# Patient Record
Sex: Female | Born: 1975 | Race: Black or African American | Hispanic: No | Marital: Single | Smoking: Current every day smoker
Health system: Southern US, Community
[De-identification: ages and names within clinical notes are randomized; demographics above are authoritative.]

---

## 2009-09-28 ENCOUNTER — Emergency Department (HOSPITAL_COMMUNITY): Admission: EM | Admit: 2009-09-28 | Discharge: 2009-09-28 | Payer: Self-pay | Admitting: Family Medicine

## 2010-01-22 ENCOUNTER — Emergency Department (HOSPITAL_COMMUNITY)
Admission: EM | Admit: 2010-01-22 | Discharge: 2010-01-22 | Payer: Self-pay | Source: Home / Self Care | Admitting: Emergency Medicine

## 2010-03-23 LAB — WOUND CULTURE

## 2011-07-22 ENCOUNTER — Emergency Department (HOSPITAL_COMMUNITY)
Admission: EM | Admit: 2011-07-22 | Discharge: 2011-07-23 | Disposition: A | Discharge status: Other Acute Inpatient Hospital | Payer: Self-pay | Attending: Emergency Medicine | Admitting: Emergency Medicine

## 2011-07-22 ENCOUNTER — Encounter (HOSPITAL_COMMUNITY): Payer: Self-pay | Admitting: *Deleted

## 2011-07-22 DIAGNOSIS — F329 Major depressive disorder, single episode, unspecified: Secondary | ICD-10-CM

## 2011-07-22 DIAGNOSIS — F3289 Other specified depressive episodes: Secondary | ICD-10-CM | POA: Insufficient documentation

## 2011-07-22 DIAGNOSIS — IMO0002 Reserved for concepts with insufficient information to code with codable children: Secondary | ICD-10-CM | POA: Insufficient documentation

## 2011-07-22 DIAGNOSIS — F32A Depression, unspecified: Secondary | ICD-10-CM

## 2011-07-22 DIAGNOSIS — F411 Generalized anxiety disorder: Secondary | ICD-10-CM | POA: Insufficient documentation

## 2011-07-22 LAB — COMPREHENSIVE METABOLIC PANEL
Albumin: 4 g/dL (ref 3.5–5.2)
Alkaline Phosphatase: 65 U/L (ref 39–117)
BUN: 8 mg/dL (ref 6–23)
Calcium: 9.4 mg/dL (ref 8.4–10.5)
Creatinine, Ser: 0.71 mg/dL (ref 0.50–1.10)
GFR calc Af Amer: >90 mL/min (ref >90)
Glucose, Bld: 98 mg/dL (ref 70–99)
Total Protein: 7.5 g/dL (ref 6.0–8.3)

## 2011-07-22 LAB — CBC
HCT: 34.5 % — ABNORMAL LOW (ref 36.0–46.0)
Hemoglobin: 11.8 g/dL — ABNORMAL LOW (ref 12.0–15.0)
MCH: 24 pg — ABNORMAL LOW (ref 26.0–34.0)
MCHC: 34.2 g/dL (ref 30.0–36.0)
MCV: 70.3 fL — ABNORMAL LOW (ref 78.0–100.0)
RDW: 14.6 % (ref 11.5–15.5)

## 2011-07-22 LAB — RAPID URINE DRUG SCREEN, HOSP PERFORMED
Barbiturates: NOT DETECTED
Cocaine: NOT DETECTED

## 2011-07-22 LAB — ACETAMINOPHEN LEVEL: Acetaminophen (Tylenol), Serum: <15 ug/mL (ref 10–30)

## 2011-07-22 LAB — ETHANOL: Alcohol, Ethyl (B): <11 mg/dL (ref 0–11)

## 2011-07-22 MED ORDER — IBUPROFEN 600 MG PO TABS
600.0000 mg | ORAL_TABLET | Freq: Three times a day (TID) | ORAL | Status: DC | PRN
  Administered 2011-07-23: 600 mg via ORAL
  Filled 2011-07-22: qty 1

## 2011-07-22 MED ORDER — BACITRACIN ZINC 500 UNIT/GM EX OINT
TOPICAL_OINTMENT | CUTANEOUS | Status: AC
  Filled 2011-07-22: qty 0.9

## 2011-07-22 MED ORDER — LORAZEPAM 1 MG PO TABS
1.0000 mg | ORAL_TABLET | Freq: Three times a day (TID) | ORAL | Status: DC | PRN
  Administered 2011-07-22 – 2011-07-23 (×2): 1 mg via ORAL
  Filled 2011-07-22 (×2): qty 1

## 2011-07-22 MED ORDER — ALUM & MAG HYDROXIDE-SIMETH 200-200-20 MG/5ML PO SUSP: 30.0000 mL | ORAL | Status: DC | PRN

## 2011-07-22 MED ORDER — ACETAMINOPHEN 325 MG PO TABS: 650.0000 mg | ORAL_TABLET | ORAL | Status: DC | PRN

## 2011-07-22 MED ORDER — ONDANSETRON HCL 4 MG PO TABS: 4.0000 mg | ORAL_TABLET | Freq: Three times a day (TID) | ORAL | Status: DC | PRN

## 2011-07-22 NOTE — ED Notes (Signed)
Patients father into see 

## 2011-07-22 NOTE — ED Provider Notes (Signed)
History     CSN: [id number]  Arrival date & time 07/22/11  1243   First MD Initiated Contact with Patient 07/22/11 1252      Chief Complaint  Patient presents with  . Medical Clearance    (Consider location/radiation/quality/duration/timing/severity/associated sxs/prior treatment) HPI Comments: Patient presents today with feelings of depression. She states that she's had a hard time with depression over the last year it's been recently worsening. She feels like she just has ongoing vomiting and has had thoughts of suicide including slitting her wrists or overdosing on pills. She denies any specific triggers for the depression, she states that she just can't handle life anymore. She does admit to alcohol use, the last was yesterday early this morning. She states that she only drinks about one to 2 times a week. She she does use marijuana, but denies any other drug use. Denies a history of depression or past psychiatric admissions. She currently denies any physical complaints  The history is provided by the patient.    History reviewed. No pertinent past medical history.  History reviewed. No pertinent past surgical history.  No family history on file.  History  Substance Use Topics  . Smoking status: Current Everyday Smoker  . Smokeless tobacco: Not on file  . Alcohol Use: Yes    OB History    Grav Para Term Preterm Abortions TAB SAB Ect Mult Living                  Review of Systems  Constitutional: Negative for fever, chills, diaphoresis and fatigue.  HENT: Negative for congestion, rhinorrhea and sneezing.   Eyes: Negative.   Respiratory: Negative for cough, chest tightness and shortness of breath.   Cardiovascular: Negative for chest pain and leg swelling.  Gastrointestinal: Negative for nausea, vomiting, abdominal pain, diarrhea and blood in stool.  Genitourinary: Negative for frequency, hematuria, flank pain and difficulty urinating.  Musculoskeletal: Negative for  back pain and arthralgias.  Skin: Negative for rash.  Neurological: Negative for dizziness, speech difficulty, weakness, numbness and headaches.  Psychiatric/Behavioral: Positive for suicidal ideas, disturbed wake/sleep cycle and agitation. Negative for hallucinations. The patient is nervous/anxious.     Allergies  Review of patient's allergies indicates no known allergies.  Home Medications   Current Outpatient Rx  Name Route Sig Dispense Refill  . IBUPROFEN 800 MG PO TABS Oral Take 800 mg by mouth every 8 (eight) hours as needed. For pain      BP 111/63  Pulse 102  Temp 98.3 F (36.8 C) (Oral)  Resp 20  Ht 5\' 5"  (1.651 m)  SpO2 100%  LMP 06/25/2011  Physical Exam  Constitutional: She is oriented to person, place, and time. She appears well-developed and well-nourished.  HENT:  Head: Normocephalic and atraumatic.  Eyes: Pupils are equal, round, and reactive to light.  Neck: Normal range of motion. Neck supple.  Cardiovascular: Normal rate, regular rhythm and normal heart sounds.   Pulmonary/Chest: Effort normal and breath sounds normal. No respiratory distress. She has no wheezes. She has no rales. She exhibits no tenderness.  Abdominal: Soft. Bowel sounds are normal. There is no tenderness. There is no rebound and no guarding.  Musculoskeletal: Normal range of motion. She exhibits no edema.  Lymphadenopathy:    She has no cervical adenopathy.  Neurological: She is alert and oriented to person, place, and time.  Skin: Skin is warm and dry. No rash noted.  Psychiatric: Her mood appears anxious. She exhibits a depressed mood.  ED Course  Procedures (including critical care time)  Results for orders placed during the hospital encounter of 07/22/11  CBC      Component Value Range   WBC 12.2 (*) 4.0 - 10.5 K/uL   RBC 4.91  3.87 - 5.11 MIL/uL   Hemoglobin 11.8 (*) 12.0 - 15.0 g/dL   HCT 45.4 (*) 09.8 - 11.9 %   MCV 70.3 (*) 78.0 - 100.0 fL   MCH 24.0 (*) 26.0 - 34.0  pg   MCHC 34.2  30.0 - 36.0 g/dL   RDW 14.7  82.9 - 56.2 %   Platelets 378  150 - 400 K/uL  COMPREHENSIVE METABOLIC PANEL      Component Value Range   Sodium 136  135 - 145 mEq/L   Potassium 3.4 (*) 3.5 - 5.1 mEq/L   Chloride 104  96 - 112 mEq/L   CO2 17 (*) 19 - 32 mEq/L   Glucose, Bld 98  70 - 99 mg/dL   BUN 8  6 - 23 mg/dL   Creatinine, Ser 1.30  0.50 - 1.10 mg/dL   Calcium 9.4  8.4 - 86.5 mg/dL   Total Protein 7.5  6.0 - 8.3 g/dL   Albumin 4.0  3.5 - 5.2 g/dL   AST 17  0 - 37 U/L   ALT 8  0 - 35 U/L   Alkaline Phosphatase 65  39 - 117 U/L   Total Bilirubin 0.4  0.3 - 1.2 mg/dL   GFR calc non Af Amer >90  >90 mL/min   GFR calc Af Amer >90  >90 mL/min  ETHANOL      Component Value Range   Alcohol, Ethyl (B) <11  0 - 11 mg/dL  ACETAMINOPHEN LEVEL      Component Value Range   Acetaminophen (Tylenol), Serum <15.0  10 - 30 ug/mL  URINE RAPID DRUG SCREEN (HOSP PERFORMED)      Component Value Range   Opiates NONE DETECTED  NONE DETECTED   Cocaine NONE DETECTED  NONE DETECTED   Benzodiazepines NONE DETECTED  NONE DETECTED   Amphetamines NONE DETECTED  NONE DETECTED   Tetrahydrocannabinol POSITIVE (*) NONE DETECTED   Barbiturates NONE DETECTED  NONE DETECTED  POCT PREGNANCY, URINE      Component Value Range   Preg Test, Ur NEGATIVE  NEGATIVE   No results found.   1. Depression       MDM  Pt with depression, SI.  Awaiting ACT evaluation        Rolan Bucco, MD 07/22/11 1406

## 2011-07-22 NOTE — ED Notes (Signed)
ACT into see 

## 2011-07-22 NOTE — ED Notes (Signed)
Pt feeling stressed out when talking with Dr Fredderick Phenix, pt states she promised her wife she would not do this.

## 2011-07-22 NOTE — ED Notes (Signed)
EMS reports pt call cousin due to being upset, he called 911, cousin requesting she be taken to the [place]

## 2011-07-22 NOTE — ED Notes (Signed)
Report called to Janie, RN in Ps[facility] ED 

## 2011-07-22 NOTE — ED Notes (Signed)
Pt states she is having anxiety, feelings of wanting to harm self. Has plan to slit wrist and take some pills. Pt is hyperventilating during triage assessment

## 2011-07-22 NOTE — ED Notes (Signed)
WUJ:[id number]<YN> Expected date:07/22/11<BR> Expected time:12:32 PM<BR> Means of arrival:Ambulance<BR> Comments:<BR> V 70.1
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
## 2011-07-23 ENCOUNTER — Encounter (HOSPITAL_COMMUNITY): Payer: Self-pay | Admitting: *Deleted

## 2011-07-23 ENCOUNTER — Inpatient Hospital Stay (HOSPITAL_COMMUNITY)
Admission: AD | Admit: 2011-07-23 | Discharge: 2011-07-26 | DRG: 880 | Disposition: A | Discharge status: Home / Self Care | Payer: Federal, State, Local not specified - Other | Source: Ambulatory Visit | Attending: Psychiatry | Admitting: Psychiatry

## 2011-07-23 DIAGNOSIS — F1021 Alcohol dependence, in remission: Secondary | ICD-10-CM | POA: Diagnosis present

## 2011-07-23 DIAGNOSIS — F609 Personality disorder, unspecified: Secondary | ICD-10-CM | POA: Diagnosis present

## 2011-07-23 DIAGNOSIS — F1220 Cannabis dependence, uncomplicated: Secondary | ICD-10-CM | POA: Diagnosis present

## 2011-07-23 DIAGNOSIS — R45851 Suicidal ideations: Secondary | ICD-10-CM

## 2011-07-23 DIAGNOSIS — F411 Generalized anxiety disorder: Principal | ICD-10-CM | POA: Diagnosis present

## 2011-07-23 MED ORDER — ALUM & MAG HYDROXIDE-SIMETH 200-200-20 MG/5ML PO SUSP: 30.0000 mL | ORAL | Status: DC | PRN

## 2011-07-23 MED ORDER — MAGNESIUM HYDROXIDE 400 MG/5ML PO SUSP: 30.0000 mL | Freq: Every day | ORAL | Status: DC | PRN

## 2011-07-23 MED ORDER — TRAZODONE HCL 50 MG PO TABS
50.0000 mg | ORAL_TABLET | Freq: Every evening | ORAL | Status: DC | PRN
  Administered 2011-07-23: 50 mg via ORAL
  Filled 2011-07-23: qty 1

## 2011-07-23 MED ORDER — ZOLPIDEM TARTRATE 10 MG PO TABS: 10.0000 mg | ORAL_TABLET | Freq: Once | ORAL | Status: DC

## 2011-07-23 MED ORDER — NAPROXEN 500 MG PO TABS
500.0000 mg | ORAL_TABLET | Freq: Two times a day (BID) | ORAL | Status: DC | PRN
Start: 2011-07-23 — End: 2011-07-26
  Administered 2011-07-23 – 2011-07-24 (×2): 500 mg via ORAL
  Filled 2011-07-23 (×2): qty 1

## 2011-07-23 MED ORDER — ZOLPIDEM TARTRATE 10 MG PO TABS
ORAL_TABLET | ORAL | Status: AC
  Administered 2011-07-23: 10 mg
  Filled 2011-07-23: qty 1

## 2011-07-23 MED ORDER — CITALOPRAM HYDROBROMIDE 20 MG PO TABS
20.0000 mg | ORAL_TABLET | Freq: Every day | ORAL | Status: DC
  Administered 2011-07-23 – 2011-07-26 (×4): 20 mg via ORAL
  Filled 2011-07-23 (×4): qty 1
  Filled 2011-07-23: qty 14
  Filled 2011-07-23 (×2): qty 1

## 2011-07-23 MED ORDER — ACETAMINOPHEN 325 MG PO TABS: 650.0000 mg | ORAL_TABLET | Freq: Four times a day (QID) | ORAL | Status: DC | PRN
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
## 2011-07-23 NOTE — Progress Notes (Signed)
Pt has been in bed since this writer came on shift at 1900.  Pt reports she just came on the unit this afternoon.  She is still very depressed, but contracts for safety on the unit.  She says she has anger issues and wants help dealing with her emotions.  She is requesting something for sleep at bedtime which will be given at the appropriate time.  She voices no other needs/concerns.  Safety maintained with q15 minute checks.

## 2011-07-23 NOTE — Progress Notes (Signed)
Patient ID: Tracie Cannon, female   DOB: [birth year], 36 y.o.   MRN: [id number] Pt was admitted for increasing depression and anger.  Says that she really came in because "I get so angry and I can't control it.  I blow up and it is like I'm another person."  Said when she gets angry at work she has to walk away and walk around awhile to cool off.  Says she has shut everyone out.  Says she has been the strong one everyone leans on and she doesn't know how to lean on others.  She and her wife  have recently lost twin girls to miscarriage on Valentine's day.  She and her wife have been in [facility] for this.  She has no medical problems except back pain that stays 3/10.  Says that she usees marijuana or a drink at bedtime to help ease the pain for sleep.  She has no allergies and has not been on any prescribed meds.  She did serve in [employer]  and does have flashbacks from that time.  Pt is quiet spoken, cooperative and works full time for the postal service.  She says her job is stressful.  Pt denies feeling suicidal at this time, but she did have SI with a plan to overdose and cut her wrists prior to admission. Her goal is to find out why she gets so anrgy and wants it to stop. Pt declined to go to lunch, says she is not hungry.  She is resting in bed as she has menstrual cramps.

## 2011-07-23 NOTE — ED Provider Notes (Signed)
8:19 AM Filed Vitals:   07/23/11 0536  BP: 105/66  Pulse: 81  Temp: 98.1 F (36.7 C)  Resp: 20   No complaints this AM. Would like a bath. Will arrange. Awaiting placement at this time  Lyanne Co, MD 07/23/11 [phone]

## 2011-07-24 DIAGNOSIS — F411 Generalized anxiety disorder: Secondary | ICD-10-CM | POA: Diagnosis present

## 2011-07-24 LAB — TSH: TSH: 1.193 u[IU]/mL (ref 0.350–4.500)

## 2011-07-24 MED ORDER — HYDROXYZINE HCL 25 MG PO TABS
25.0000 mg | ORAL_TABLET | Freq: Three times a day (TID) | ORAL | Status: DC | PRN
  Administered 2011-07-24 – 2011-07-25 (×2): 25 mg via ORAL
  Filled 2011-07-24: qty 42
  Filled 2011-07-24: qty 1

## 2011-07-24 MED ORDER — TRAZODONE HCL 100 MG PO TABS
100.0000 mg | ORAL_TABLET | Freq: Every day | ORAL | Status: DC
  Administered 2011-07-24 – 2011-07-25 (×2): 100 mg via ORAL
  Filled 2011-07-24: qty 14
  Filled 2011-07-24 (×4): qty 1

## 2011-07-24 NOTE — Progress Notes (Signed)
Grief and Loss Group  Facilitated a grief and loss group with Elige Radon McKibben, MS, LPCA, NCC. Dr. Burnett Sheng, writer's and Bradley's supervisor, was also in attendance. The group discussed and processed their experiences of grief and loss. They also shared associated emotions including sadness, guilt, fear, loneliness, etc. Group also discussed the process of grief and recognized the different places they are in within their grief experiences. As a whole, group members were supportive of each other. At the end of the group, members were encouraged to choose a picture that represented their experience of grief and share it with the group.  Patient was quiet and respectful during the group. She shared her experience regarding the loss of her child and how painful it has been for her. She shared that she not only grieves her child, but she also grieves the loss of having a relationship with her child (since the child died as an infant). Another group member shared feelings of guilt and a loss of identity, which the patient resonated with as well. She said that at this point, she feels like she lost who she was an she is unsure how to move forward. At the end of the group, the patient chose a picture of a baby footprint made of rocks. She said that for her, it represents the last thing that she has of her child (a footprint). Patient was very respectful and supportive of other group members.  Evelene Croon MA, MED, LPCA, NCC

## 2011-07-24 NOTE — Progress Notes (Signed)
[facility] Group Notes: (Counselor/Nursing/MHT/Case Management/Adjunct) 07/24/2011   @ 1:15-2:30PM Feelings About Diagnosis  Type of Therapy:  Group Therapy  Participation Level:  Active  Participation Quality: Appropriate, Sharing, Supportive   Affect:  Blunted  Cognitive:  Appropriate  Insight:  Good  Engagement in Group: Good   Engagement in Therapy:  Good  Modes of Intervention:  Support and Exploration  Summary of Progress/Problems: Tracie Cannon explored how views on mental illness have changed over the last several decades. She stated that in her family and culture, it is still very taboo to need help with one's mental/emotional wellness, and many in that culture treat her as though she is fragile and can lose control at any time. Tracie Cannon processed her own thoughts, stating that the things she has gone through have brought her to this point and sometimes she needs guidance in how to take back her ability to control her emotions. She explored the tenets of the Recovery Model and seemed interested in not only the model but also in a peer community.  Angus Palms, LCSW 07/24/2011  4:10 PM

## 2011-07-24 NOTE — Progress Notes (Signed)
(  D) Patient states that she did not sleep well last night, appetite poor only eating 1% of breakfast, energy level low, ability to pay attention improving rates depression 2/10 and hopelessness as 0/10. Denies SI/HI and psychosis. (A) Encouraged patient to attend groups. Encouraged and supported. (R) Shares that at discharge she wants to improve communication with her wife and family and to develop a broader support network. [name] RN MS EdS 07/24/2011  1:12 PM

## 2011-07-24 NOTE — Progress Notes (Addendum)
[facility] MD Progress Note  07/24/2011 4:11 PM  "I feel much better today. I could do better if I can sleep better. I was giving some medicine for sleep last night, it did not help quite much. My anxiety gets worse if I don't sleep well"  Diagnosis:  Axis I: Generalized Anxiety Disorder Axis II: Deferred Axis III: No past medical history on file. Axis IV: other psychosocial or environmental problems Axis V: 11-20 some danger of hurting self or others possible OR occasionally fails to maintain minimal personal hygiene OR gross impairment in communication  ADL's:  Intact  Sleep: Fair  Appetite:  Good  Suicidal Ideation:  Plan:  No Intent:  No Means:  No Homicidal Ideation:  Plan:  No Intent:  No Means:  No  AEB (as evidenced by): Per patient  Mental Status Examination/Evaluation: Objective:  Appearance: Casual  Eye Contact::  Good  Speech:  Clear and Coherent  Volume:  Normal  Mood:  "I feel much better"  Affect:  Appropriate  Thought Process:  Coherent  Orientation:  Full  Thought Content:  Rumination  Suicidal Thoughts:  No  Homicidal Thoughts:  No  Memory:  Immediate;   Good Recent;   Good Remote;   Good  Judgement:  Good  Insight:  Fair  Psychomotor Activity:  Normal  Concentration:  Good  Recall:  Good  Akathisia:  No  Handed:  Right  AIMS (if indicated):     Assets:  Desire for Improvement  Sleep:  Number of Hours: 4.25    Vital Signs:Blood pressure 119/83, pulse 94, temperature 97.5 F (36.4 C), temperature source Oral, resp. rate 16, height 5\' 4"  (1.626 m), weight 78.926 kg (174 lb), last menstrual period 07/23/2011. Current Medications: Current Facility-Administered Medications  Medication Dose Route Frequency Provider Last Rate Last Dose  . acetaminophen (TYLENOL) tablet 650 mg  650 mg Oral Q6H PRN Verne Spurr, PA-C      . alum & mag hydroxide-simeth (MAALOX/MYLANTA) 200-200-20 MG/5ML suspension 30 mL  30 mL Oral Q4H PRN Verne Spurr, PA-C      .  citalopram (CELEXA) tablet 20 mg  20 mg Oral Daily Verne Spurr, PA-C   20 mg at 07/24/11 0818  . magnesium hydroxide (MILK OF MAGNESIA) suspension 30 mL  30 mL Oral Daily PRN Verne Spurr, PA-C      . naproxen (NAPROSYN) tablet 500 mg  500 mg Oral BID PRN Sanjuana Kava, NP   500 mg at 07/24/11 0645  . traZODone (DESYREL) tablet 50 mg  50 mg Oral QHS PRN Verne Spurr, PA-C   50 mg at 07/23/11 2344    Lab Results:  Results for orders placed during the hospital encounter of 07/23/11 (from the past 48 hour(s))  TSH     Status: Normal   Collection Time   07/23/11  8:05 PM      Component Value Range Comment   TSH 1.193  0.350 - 4.500 uIU/mL     Physical Findings: AIMS: Facial and Oral Movements Muscles of Facial Expression: None, normal Lips and Perioral Area: None, normal Jaw: None, normal Tongue: None, normal,Extremity Movements Upper (arms, wrists, hands, fingers): None, normal Lower (legs, knees, ankles, toes): None, normal, Trunk Movements Neck, shoulders, hips: None, normal, Overall Severity Severity of abnormal movements (highest score from questions above): None, normal Incapacitation due to abnormal movements: None, normal Patient's awareness of abnormal movements (rate only patient's report): No Awareness, Dental Status Current problems with teeth and/or dentures?: No Does patient usually  wear dentures?: No  CIWA:    COWS:     Treatment Plan Summary: Daily contact with patient to assess and evaluate symptoms and progress in treatment Medication management  Plan: Increase Trazodone from 50 to 100 mg Q hs. Hydroxyzine 25 mg tid prn for anxiety. Continue current treatment plan.  Armandina Stammer I 07/24/2011, 4:11 PM

## 2011-07-24 NOTE — [facility] Counselor (Signed)
Adult Comprehensive Assessment  Patient ID: Tracie Cannon, female   DOB: 05-24-1975, 36 y.o.   MRN: [id number]  Information Source: Information source: Patient  Current Stressors:  Educational / Learning stressors: no stressors reported Employment / Job issues: gets very stressed and anxious at work Family Relationships: stress over loss of 2 unborn Ecologist / Lack of resources (include bankruptcy): financial problems - bills Housing / Lack of housing: no stressors reported Physical health (include injuries & life threatening diseases): chronic back pain Social relationships: does not know how to access supports Substance abuse: marijuana and a few shots of alcohol at bedtime to control pain Bereavement / Loss: miscarriage of twins 6 months ago and their due date (next week)  Living/Environment/Situation:  Living Arrangements: Spouse/significant other Living conditions (as described by patient or guardian): lives with wife How long has patient lived in current situation?: 4 years What is atmosphere in current home: Comfortable;Loving;Supportive  Family History:  Marital status: Married Number of Years Married: 1  What types of issues is patient dealing with in the relationship?: supportive  Does patient have children?: No (twins miscarried 6 months ago)  Childhood History:  By whom was/is the patient raised?: Both parents Description of patient's relationship with caregiver when they were a child: good  Patient's description of current relationship with people who raised him/her: close with both Does patient have siblings?: Yes Number of Siblings: 3  Description of patient's current relationship with siblings: 2 sis 1 bro, pretty supportive but has not reached out to them Did patient suffer any verbal/emotional/physical/sexual abuse as a child?: No Did patient suffer from severe childhood neglect?: No Has patient ever been sexually abused/assaulted/raped as an adolescent or  adult?: No Was the patient ever a victim of a crime or a disaster?: No Witnessed domestic violence?: No Has patient been effected by domestic violence as an adult?: No  Education:  Highest grade of school patient has completed: some college for computer information systems Currently a student?: Yes If yes, how has current illness impacted academic performance: has not impacted Name of school: [employer] with plan to transfer to [employer] person: unknown How long has the patient attended?: 1 year Learning disability?: No  Employment/Work Situation:   Employment situation: Employed 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 How long has patient been employed?: 4 years Patient's job has been impacted by current illness: No What is the longest time patient has a held a job?: 8 &1/2 years 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 Has patient ever been in the [employer]?: Yes (Describe in comment) Has patient ever served in combat?: Yes Patient description of combat service: [place] combat experience, but also 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  Surveyor, quantity Resources:   Surveyor, quantity resources: Income from spouse Does patient have a representative payee or guardian?: No  Alcohol/Substance Abuse:   What has been your use of drugs/alcohol within the last 12 months?: started smoking weed to hlep sleep 6 months after getting out of [employer], now drinking a couple of shots before bed Alcohol/Substance Abuse Treatment Hx: Denies past history Has alcohol/substance abuse ever caused legal problems?: No  Social Support System:   Forensic psychologist System: Good Describe Community Support System: wife, parents, 5 siblings, friends  Type of faith/religion: n/a How does patient's faith help to cope with current illness?: n/a  Leisure/Recreation:   Leisure and Hobbies: goes to [employer] group for loss of children  Strengths/Needs:  What  things does the patient do well?: determined and  In what areas does patient struggle / problems for patient: anxiety, depression, suicidal thoughts, recently wife had a miscarriage of twins, things began tumbling down after the miscarriage, financial anxiety, trying to go back to school  Discharge Plan:   Does patient have access to transportation?: Yes Will patient be returning to same living situation after discharge?: Yes Currently receiving community mental health services: Yes (From Whom) ([facility], and just called to set up counseling with [facility] ) If no, would patient like referral for services when discharged?: Yes (What county?) ([place] - [employer] psychiatrist) Does patient have financial barriers related to discharge medications?: No  Summary/Recommendations:   Summary and Recommendations (to be completed by the evaluator): Tracie Cannon is a 36 year old married female diagnosed with PTSD. She reports that the main stressor is the miscarriage her wife suffered with their twins in February. Reports she is also stressed about money and her job. Has been out of the [employer] for 2 years but still is not used to [employer] life and has not been able to reach out to the people who would be supportive of her. Tracie Cannon would beneift from crisis staibilization, medication evaluation, therapy groups for processing thoughts/feelings/experiences, psychoed groups for coping skills and case management for discharge planning.   Tracie Cannon, Tracie Cannon. 07/24/2011

## 2011-07-24 NOTE — Progress Notes (Signed)
Patient seen during during d/c planning group and or treatment team.  She reports prior to admission but is currently denying SI/HI.  She reports becoming increasingly depressed following partner having a miscarriage.  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  She rates depression at one and and anxiety at seven. She denies helplessness and hopelessness.

## 2011-07-24 NOTE — [facility] Suicide Risk Assessment (Signed)
Suicide Risk Assessment  Admission Assessment     Demographic factors:  See chart.  Current Mental Status:  Patient seen and evaluated. Chart reviewed. Patient stated that her mood was "ok". Her affect was mood congruent and constricted. She denied any current thoughts of self injurious behavior, suicidal ideation or homicidal ideation. There were no auditory or visual hallucinations, paranoia, delusional thought processes, or mania noted.  Thought process was linear and goal directed.  No psychomotor agitation or retardation was noted. Speech was normal rate, tone and volume. Eye contact was good. Judgment and insight are fair.  Patient has been up and engaged on the unit.  No acute safety concerns reported from team.    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  SUICIDE RISK: Pt viewed as a chronic increased risk of harm to self in light of her past hx and risk factors.  No acute safety concerns on the unit.  Pt contracting for safety and in need of crisis stabilization & Tx.  VS:  Filed Vitals:   07/25/11 0601  BP: 131/81  Pulse: 84  Temp:   Resp:    Meds:    . citalopram  20 mg Oral Daily  . traZODone  100 mg Oral QHS    PLAN OF CARE: Pt admitted for crisis stabilization and treatment.  Please see orders, initiated on above meds per team.  Medications reviewed with pt and medication education provided.  Will continue q15 minute checks per unit protocol.  No clinical indication for one on one level of  observation at this time.  Pt contracting for safety.  Mental health treatment, medication management and continued sobriety will mitigate against the increased risk of harm to self and/or others.  Discussed the importance of recovery and Tx with pt, as well as, tools to move forward in a healthy & safe manner.  Pt agreeable with the plan.  Discussed with the team.   Lupe Carney 07/24/2011, 4:55 PM

## 2011-07-24 NOTE — Progress Notes (Signed)
Psychoeducational Group Note  Date:  07/24/2011 Time:  1100  Group Topic/Focus:  Recovery Goals:   The focus of this group is to identify appropriate goals for recovery and establish a plan to achieve them.  Participation Level:  Active  Participation Quality:  Appropriate and Attentive  Affect:  Appropriate  Cognitive:  Alert and Appropriate  Insight:  Good  Engagement in Group:  Good  Additional Comments:  Pt. Participated in group and was able to identify her definition of recovery, changes she needs to make, and steps to make changes.   Tracie Cannon [facility] 07/24/2011, 7:38 PM

## 2011-07-24 NOTE — Therapy (Signed)
Psychoeducational Group Note  Date:  07/24/2011 Time:  2005  Group Topic/Focus:  Wrap-Up Group:   The focus of this group is to help patients review their daily goal of treatment and discuss progress on daily workbooks.  Participation Level:  Active  Participation Quality:  Sharing and Supportive  Affect:  Appropriate  Cognitive:  Appropriate  Insight:  Good  Engagement in Group:  Good  Additional Comments:   Patient attended and participated in wrap-up group this evening. Patient shared that her goal for today was attend each and every group time and that she did achieve that goal today.  Javien Tesch, Newton Pigg 07/24/2011, 9:04 PM

## 2011-07-24 NOTE — Tx Team (Signed)
Interdisciplinary Treatment Plan Update (Adult)  Date:  07/24/2011  Time Reviewed:  10:39 AM   Progress in Treatment: Attending groups:   Yes   Participating in groups:  Yes Taking medication as prescribed:  Yes Tolerating medication:  Yes Family/Significant othe contact made: Counselor to make contact with family Patient understands diagnosis:  Yes Discussing patient identified problems/goals with staff: Yes Medical problems stabilized or resolved: Yes Denies suicidal/homicidal ideation:Yes Issues/concerns per patient self-inventory:  Other:  New problem(s) identified:  Reason for Continuation of Hospitalization: Anxiety Depression Medication stabilization  Interventions implemented related to continuation of hospitalization:  Medication Management; safety checks q 15 mins  Additional comments:  Estimated length of stay: 3-4 days  Discharge Plan:  Home with outpatient follow up  New goal(s):  Review of initial/current patient goals per problem list:    1.  Goal(s): Eliminate SI/other thoughts of self harm ]  Met:  Yes  Target date: d/c  As evidenced by: Patient no longer endorses SI/other thought self harm    2.  Goal (s): Reduce anxiety (rated at seven today)   Met:  No  Target date: d/c  As evidenced by: Patient will rate symptoms at four or below  3.  Goal(s):  .Reduce depression (patient rates depression at one today)   Met:  Yes  Target date: d/c  As evidenced by: Patient currently rating symptoms at below four    4.  Goal(s): .stabilize on meds   Met:  No  Target date: d/c  As evidenced by: Patient will report being stable on medications - symptoms have decreased    Attendees: Patient:  Tracie Cannon 07/24/2011 10:39 AM  Other: Serena Colonel, NP   07/24/2011 10:40 AM  Nursing:  Arrie Eastern, RN 07/24/2011 10:39 AM   Nursing:   Neill Loft, RN 07/24/2011 10:39 AM   CaseManager:  Juline Patch, LCSW 07/24/2011 10:39 AM   Counselor:   Angus Palms, LCSW 07/24/2011 10:39 AM   Other:  Reyes Ivan, LCSWA Other:  Dawn Lexine Baton, RN      07/24/2011 10:41 AM

## 2011-07-25 MED ORDER — PROPRANOLOL HCL 20 MG PO TABS
20.0000 mg | ORAL_TABLET | Freq: Four times a day (QID) | ORAL | Status: DC
  Administered 2011-07-26 (×2): 20 mg via ORAL
  Filled 2011-07-25: qty 56
  Filled 2011-07-25 (×3): qty 1
  Filled 2011-07-25 (×3): qty 56
  Filled 2011-07-25 (×2): qty 1

## 2011-07-25 MED ORDER — PROPRANOLOL HCL 10 MG PO TABS
10.0000 mg | ORAL_TABLET | Freq: Four times a day (QID) | ORAL | Status: AC
  Administered 2011-07-25 (×2): 10 mg via ORAL
  Filled 2011-07-25 (×4): qty 1

## 2011-07-25 NOTE — Progress Notes (Signed)
[facility] MD Progress Note  07/25/2011 11:05 PM  S/O: "I still feel quite anxious at times."  Pt describes waves of anxiety that sweep over her leaving her numb.  Diagnosis:  Axis I: Generalized Anxiety Disorder Axis II: Deferred Axis III: No past medical history on file. Axis IV: other psychosocial or environmental problems Axis V: 11-20 some danger of hurting self or others possible OR occasionally fails to maintain minimal personal hygiene OR gross impairment in communication  ADL's:  Intact  Sleep: Fair  Appetite:  Good  Suicidal Ideation:  Plan:  No Intent:  No Means:  No Homicidal Ideation:  Plan:  No Intent:  No Means:  No  AEB (as evidenced by): Per patient  Mental Status Examination/Evaluation: Objective:  Appearance: Casual  Eye Contact::  Good  Speech:  Clear and Coherent  Volume:  Normal  Mood:  Anxious  Affect:  Appropriate  Thought Process:  Coherent  Orientation:  Full  Thought Content:  Rumination  Suicidal Thoughts:  No  Homicidal Thoughts:  No  Memory:  Immediate;   Good Recent;   Good Remote;   Good  Judgement:  Good  Insight:  Fair  Psychomotor Activity:  Normal  Concentration:  Good  Recall:  Good  Akathisia:  No  Handed:  Right  AIMS (if indicated):     Assets:  Desire for Improvement  Sleep:  Number of Hours: 6    Vital Signs:Blood pressure 107/72, pulse 74, temperature 96.8 F (36 C), temperature source Oral, resp. rate 16, height 5\' 4"  (1.626 m), weight 78.926 kg (174 lb), last menstrual period 07/23/2011. Current Medications: Current Facility-Administered Medications  Medication Dose Route Frequency Provider Last Rate Last Dose  . acetaminophen (TYLENOL) tablet 650 mg  650 mg Oral Q6H PRN Verne Spurr, PA-C      . alum & mag hydroxide-simeth (MAALOX/MYLANTA) 200-200-20 MG/5ML suspension 30 mL  30 mL Oral Q4H PRN Verne Spurr, PA-C      . citalopram (CELEXA) tablet 20 mg  20 mg Oral Daily Verne Spurr, PA-C   20 mg at 07/25/11 0758    . hydrOXYzine (ATARAX/VISTARIL) tablet 25 mg  25 mg Oral TID PRN Sanjuana Kava, NP   25 mg at 07/25/11 0758  . magnesium hydroxide (MILK OF MAGNESIA) suspension 30 mL  30 mL Oral Daily PRN Verne Spurr, PA-C      . naproxen (NAPROSYN) tablet 500 mg  500 mg Oral BID PRN Sanjuana Kava, NP   500 mg at 07/24/11 0645  . propranolol (INDERAL) tablet 10 mg  10 mg Oral QID Mike Craze, MD   10 mg at 07/25/11 2147   Followed by  . propranolol (INDERAL) tablet 20 mg  20 mg Oral QID Mike Craze, MD      . traZODone (DESYREL) tablet 100 mg  100 mg Oral QHS Sanjuana Kava, NP   100 mg at 07/25/11 2147    Lab Results:  No results found for this or any previous visit (from the past 48 hour(s)).  Physical Findings: AIMS: Facial and Oral Movements Muscles of Facial Expression: None, normal Lips and Perioral Area: None, normal Jaw: None, normal Tongue: None, normal,Extremity Movements Upper (arms, wrists, hands, fingers): None, normal Lower (legs, knees, ankles, toes): None, normal, Trunk Movements Neck, shoulders, hips: None, normal, Overall Severity Severity of abnormal movements (highest score from questions above): None, normal Incapacitation due to abnormal movements: None, normal Patient's awareness of abnormal movements (rate only patient's report): No Awareness,  Dental Status Current problems with teeth and/or dentures?: No Does patient usually wear dentures?: No  CIWA:    COWS:     Treatment Plan Summary: Daily contact with patient to assess and evaluate symptoms and progress in treatment Medication management  Plan:  Pt describes anxiety that leaves her numb over her face and legs.  This sounds like the peripheral effects of the stress response.  She notes not being able to remember things and how to do things then this happens.  Will try Inderal for that.  She is anxious to leave tomorrow.  Will see how this Inderal helps her.  She will follow up with the [place].  Melondy Blanchard,  Celene Pippins 07/25/2011, 11:05 PM

## 2011-07-25 NOTE — Progress Notes (Signed)
Patient seen during d/c planning group. She reports being much better and hopes to discharge home soon. She rates anxiety at one and depression at zero.  Patient reports medication has really helpled.

## 2011-07-25 NOTE — Progress Notes (Signed)
[facility] Adult Inpatient Family/Significant Other Suicide Prevention Education  Patient Refusal for Family/Significant Other Suicide Prevention Education: The patient Tracie Cannon has refused to provide written consent for family/significant other to be provided Family/Significant Other Suicide Prevention Education during admission and/or prior to discharge.  Physician notified.  Tracie Cannon reported that her wife is a Designer, jewellery who has had training in suicide prevention, so she already knows all the information that would be given to her. She verbalized understanding of suicide risks, warning signs, and crisis contacts as reviewed by counselor. Tracie Cannon reports she has no guns in the home.   [name] 07/25/2011, 3:22 PM

## 2011-07-25 NOTE — Progress Notes (Signed)
[facility] Group Notes:  (Counselor/Nursing/MHT/Case Management/Adjunct)  07/25/2011 2:55 PM  Type of Therapy:  Psychoeducational Skills  Participation Level:  Active  Participation Quality:  Appropriate  Affect:  Appropriate  Cognitive:  Appropriate  Insight:  Good  Engagement in Group:  Good  Engagement in Therapy:  Good  Modes of Intervention:  Education  Summary of Progress/Problems:STAFF PRESENTED A GROUP ON EMOTION EDUCATION. THE GROUP DISCUSSED WHAT FEELINGS OR EMOTIONS ARE, AND HOW THEY ARE EXPERIENCED. STAFF EXPLAINED THAT HAVING EMOTIONS IS JUST A PART OF BEING HUMAN.  STAFF ENCOURAGED THE PATIENT TO OPENLY SHARE WHAT FEELINGS ARE  AND TO STATE ONE WORD THAT DESCRIBES HOW THEY FEEL ON TODAY. PATIENT WAS ENGAGED IN THE DISCUSSION AND PROVIDED THE GROUP WITH FEEDBACK ON HOW SHE FELT ON TODAY. PATIENT STATED THAT HE FELT GOOD ON TODAY AND THAT SHE ENJOYED THE GROUP. STAFF PROVIDED THE PATIENT ENCOURAGEMENT AND SUPPORT.    Ardelle Park O 07/25/2011, 2:55 PM

## 2011-07-25 NOTE — Progress Notes (Signed)
Pt reports she has had a good day.  She had visitors this evening.  She states the medications are working and her depression level is lower.  She denies SI/HI/AV.  She did ask about her meds and if she was on any narcotics.  This Clinical research associate did med educations with pt.  Pt states she has learned much since being admitted.  She feels this admission has been good for her.  She voices no other needs/concerns at this time.  Safety maintained with q15 minute checks.

## 2011-07-25 NOTE — Progress Notes (Signed)
Pt visible on the unit and participating this evening.  Pt observed interacting appropriately with other patients and staff.  She is a little more talkative this evening, but still somewhat guarded.  She requested that this writer give her a list of the medications she is on so she could tell her wife.  She also said one of her relatives was a Warden/ranger, and wanted to know.  Pt was given this hand written list as requested.  Pt denies SI/HI/AV.  Pt voiced no needs/concerns at this time.  Encouraged pt to make her needs known to staff.  Safety maintained with q15 minute checks.

## 2011-07-25 NOTE — Progress Notes (Signed)
[facility] Group Notes:  (Counselor/Nursing/MHT/Case Management/Adjunct)  07/25/2011 11:52 AM  Type of Therapy:  Psychoeducational Skills  Participation Level:  Active  Participation Quality:  Appropriate, Attentive, Redirectable and Resistant  Affect:  Appropriate and Blunted  Cognitive:  Alert, Appropriate and Oriented  Insight:  Good  Engagement in Group:  Limited  Engagement in Therapy:  n/a  Modes of Intervention:  Activity, Education, Problem-solving, Socialization and Support  Summary of Progress/Problems: Chief of Staff attended Psychoeducational group on goals. The focus of this group is to help patients establish daily goals to achieve during treatment and discuss how the patient can incorporate goal setting into their daily lives to aide in recovery. Tracie Cannon was resistant at first but became active while group discussed what makes a SMART goal, an activity reading empowering passages, and creating one SMART goal to work on for the day.   Tracie Cannon 07/25/2011, 11:52 AM

## 2011-07-25 NOTE — Progress Notes (Signed)
D: Patient denies SI/HI and A/V hallucinations; reported that she slept well because she was able to fall asleep and stay asleep most of the night; and reported that her appetite was poor only because she does not like the food here; reported depression of an 2 and hopelessness of 0; reported no pain but some racing thoughts  A: Monitored every 15 minutes; Vistaril given for racing thoughts, celexa for depression and trazadone for sleep; encouraged to talk with staff about feelings and concerns;    R: Patient is agitated in room but cooperative; taking medications as prescribed; assertive with staff about plan for treatment

## 2011-07-25 NOTE — Progress Notes (Signed)
[facility] Group Notes:  (Counselor/Nursing/MHT/Case Management/Adjunct)  07/25/2011 3:08 PM  Type of Therapy:  Group Therapy  Participation Level:  Active  Participation Quality:  Appropriate and Attentive  Affect:  Appropriate  Cognitive:  Appropriate  Insight:  Good  Engagement in Group:  Good  Engagement in Therapy:  Good  Modes of Intervention:  Education and Socialization  Summary of Progress/Problems:The focus of this group was to discuss emotional regulation.  Counselor discussed three components to regulate your emotions to include: understanding your emotions, reduce emotional vulnerability, and letting go of suffering.  Stephanine Reas was able to provide feedback regarding her suicidal thoughts, but rationalization that it would hurt so many others that are close to her if she actually followed through with acting on her thoughts.  Kately Stave was also able to reassure another group member that they should not feel any guilt if they did not play a part in the cause of the death of someone they have lost by pushing them over the edge.  Clarice Pole, LCASA 07/25/2011, 3:15 PM      Clarice Pole C 07/25/2011, 3:08 PM

## 2011-07-25 NOTE — Progress Notes (Signed)
07/25/2011      Time: 1500      Group Topic/Focus: The focus of this group is on discussing various styles of communication and communicating assertively using 'I' (feeling) statements.  Participation Level: Active  Participation Quality: Appropriate and Attentive  Affect: Irritable  Cognitive: Oriented   Additional Comments: Patient irritable at first, reporting the group room was too cold and she wanted to go outside, but quickly settled into the activity when RT promised to address the temperature issue. Patient became irritable again when a peer who was discharging interrupted group.  Chanan Detwiler 07/25/2011 3:43 PM

## 2011-07-26 MED ORDER — HYDROXYZINE HCL 25 MG PO TABS: 25.0000 mg | ORAL_TABLET | Freq: Three times a day (TID) | ORAL | Status: AC | PRN

## 2011-07-26 MED ORDER — TRAZODONE HCL 100 MG PO TABS: 100.0000 mg | ORAL_TABLET | Freq: Every day | ORAL | Status: AC

## 2011-07-26 MED ORDER — CITALOPRAM HYDROBROMIDE 20 MG PO TABS: 20.0000 mg | ORAL_TABLET | Freq: Every day | ORAL | Status: AC

## 2011-07-26 MED ORDER — PROPRANOLOL HCL 20 MG PO TABS: 20.0000 mg | ORAL_TABLET | Freq: Four times a day (QID) | ORAL | Status: AC

## 2011-07-26 NOTE — Progress Notes (Signed)
D:  Patient up and present in the milieu today.  Attending and participating in groups.  Attended treatment team this morning and was able to state positive changes since her admission.  FMLA paperwork filled out for patient.  Patient has a discharge plan and has follow up scheduled.  Reviewed all discharge instructions, medications, and follow up care.  Two week supply of medications given to patient from the hospital pharmacy.  Patient was escorted off the unit and out to the front lobby.  Her belongings were retrieved from locker number 19 on the way out.  Her mother was here to pick her up.  A:  Reviewed all discharge paperwork with the patient.   R:  Verbalized understanding of all.  States she feels like she has learned several skills during her stay here.  She has been very upbeat and positive today.  Denies suicidal ideation.

## 2011-07-26 NOTE — Tx Team (Signed)
Interdisciplinary Treatment Plan Update (Adult)  Date:  07/26/2011  Time Reviewed:  10:26 AM   Progress in Treatment: Attending groups: Yes Participating in groups:  Yes Taking medication as prescribed:  Yes Tolerating medication: Yes Family/Significant othe contact made:  No, reports that she does not want wife contacted due to her being an Tracie Cannon and aware of suicide risks Patient understands diagnosis: Yes Discussing patient identified problems/goals with staff:  Yes Medical problems stabilized or resolved: Yes Denies suicidal/homicidal ideation: Yes Issues/concerns per patient self-inventory:  No  Other:  New problem(s) identified: None  Reason for Continuation of Hospitalization: Appropriate for discharge today  Interventions implemented related to continuation of hospitalization:  Medication stabilization, safety checks q 15 mins, group attendance  Additional comments:   Estimated length of stay: Discharge today  Discharge Plan: Discharge home and follow up with Tracie Cannon  New goal(s):  Review of initial/current patient goals per problem list:   1. Goal(s): Eliminate SI/other thoughts of self harm  ]  Met: Yes  Target date: d/c  As evidenced by: Tracie Cannon denies suicidal thoughts 2. Goal (s): Reduce anxiety to rating of 4 or less Met: Yes Target date: d/c  As evidenced by: Tracie Cannon reports no anxiety today 3. Goal(s): .Reduce depression to rating of 4 or less Met: Yes  Target date: d/c  As evidenced by: Tracie Cannon denies any depression today 4. Goal(s): Stabilize on meds  Met: Yes Target date: d/c  As evidenced by: Patient will report being stable on medications - symptoms have decreased without intolerable side effects  Attendees: Patient:  Tracie Cannon 07/26/2011 10:26 AM  Family:     Physician:  Dr Orson Aloe, MD 07/26/2011 10:26 AM  Nursing:   Berneice Heinrich, RN 07/26/2011 10:26 AM  Case Manager:  Juline Patch, LCSW 07/26/2011 10:26 AM  Counselor:  Angus Palms, LCSW 07/26/2011 10:26 AM  Other:  Reyes Ivan, LCSWA 07/26/2011 10:26 AM  Other:  Burnetta Sabin, RN 07/26/2011 10:26 AM  Other:  Corinne Ports, clinical psych intern 07/26/2011 10:26 AM  Other:  Izola Price, RN 07/26/2011 10:26 AM   Scribe for Treatment Team:   [name], 07/26/2011 10:26 AM

## 2011-07-26 NOTE — Progress Notes (Signed)
Patient ID: Tracie Cannon, female   DOB: [birth year], 36 y.o.   MRN: [id number] Patient denies SI/HI and A/V hallucinations; reviewed AVS and voiced understanding and no questions and concerns at this time ,copy of AVS, prescriptions and samples of medications given to patient; all belongings returned to the patient; patient was escorted to front where family was waiting to take her home

## 2011-07-26 NOTE — Progress Notes (Signed)
[facility] Group Notes:  (Counselor/Nursing/MHT/Case Management/Adjunct)  07/26/2011 11:09 AM  Type of Therapy:  Psychoeducational Skills  Participation Level:  Active  Participation Quality:  Appropriate  Affect:  Appropriate  Cognitive:  Appropriate  Insight:  Good  Engagement in Group:  Good  Engagement in Therapy:  Good  Modes of Intervention:  Activity  Summary of Progress/Problems: Pt participated in coping skills pictonary where they were to pick a coping skill and draw it on the board. Their team guess the coping skill and are awarded a point if they get it correct. After the game staff processed with the group about why coping skills are important and when we use them. Danecia said that she can go to see her therapist and continue with counseling as a coping skill.  Alyson Reedy 07/26/2011, 11:09 AM

## 2011-07-26 NOTE — [facility] Suicide Risk Assessment (Signed)
Suicide Risk Assessment  Discharge Assessment     Demographic factors:  Tracie Cannon, lesbian, or bisexual orientation    Current Mental Status Per Nursing Assessment::   On Admission:    At Discharge:     Current Mental Status Per Physician:  Loss Factors: Loss of significant relationship  Historical Factors:    Risk Reduction Factors:      Continued Clinical Symptoms:  Severe Anxiety and/or Agitation  Discharge Diagnoses:   AXIS I:  Generalized Anxiety Disorder AXIS II:  Deferred AXIS III:  No past medical history on file. AXIS IV:  other psychosocial or environmental problems, problems related to social environment and problems with primary support group AXIS V:  51-60 moderate symptoms  Cognitive Features That Contribute To Risk:  Closed-mindedness Thought constriction (tunnel vision)    Suicide Risk:  Minimal: No identifiable suicidal ideation.  Patients presenting with no risk factors but with morbid ruminations; may be classified as minimal risk based on the severity of the depressive symptoms  S/O: "I still feel quite anxious at times." Pt describes waves of anxiety that sweep over her leaving her numb.  Diagnosis: Axis I: Generalized Anxiety Disorder  Axis II: Deferred  Axis III: No past medical history on file.  Axis IV: other psychosocial or environmental problems  Axis V: 11-20 some danger of hurting self or others possible OR occasionally fails to maintain minimal personal hygiene OR gross impairment in communication  ADL's: Intact  Sleep: Fair  Appetite: Good  Suicidal Ideation:  Plan: No  Intent: No  Means: No  Homicidal Ideation:  Plan: No  Intent: No  Means: No  AEB (as evidenced by): Per patient  Mental Status Examination/Evaluation:  Objective: Appearance: Casual   Eye Contact:: Good   Speech: Clear and Coherent   Volume: Normal   Mood: Anxious   Affect: Appropriate   Thought Process: Coherent   Orientation: Full   Thought Content:  Rumination   Suicidal Thoughts: No   Homicidal Thoughts: No   Memory: Immediate; Good  Recent; Good  Remote; Good   Judgement: Good   Insight: Fair   Psychomotor Activity: Normal   Concentration: Good   Recall: Good   Akathisia: No   Handed: Right   AIMS (if indicated):   Assets: Desire for Improvement   Sleep: Number of Hours: 6   Vital Signs:Blood pressure 107/72, pulse 74, temperature 96.8 F (36 C), temperature source Oral, resp. rate 16, height 5\' 4"  (1.626 m), weight 78.926 kg (174 lb), last menstrual period 07/23/2011.  Current Medications:  Current Facility-Administered Medications   Medication  Dose  Route  Frequency  Provider  Last Rate  Last Dose   .  acetaminophen (TYLENOL) tablet 650 mg  650 mg  Oral  Q6H PRN  Verne Spurr, PA-C     .  alum & mag hydroxide-simeth (MAALOX/MYLANTA) 200-200-20 MG/5ML suspension 30 mL  30 mL  Oral  Q4H PRN  Verne Spurr, PA-C     .  citalopram (CELEXA) tablet 20 mg  20 mg  Oral  Daily  Verne Spurr, PA-C   20 mg at 07/25/11 0758   .  hydrOXYzine (ATARAX/VISTARIL) tablet 25 mg  25 mg  Oral  TID PRN  Sanjuana Kava, NP   25 mg at 07/25/11 0758   .  magnesium hydroxide (MILK OF MAGNESIA) suspension 30 mL  30 mL  Oral  Daily PRN  Verne Spurr, PA-C     .  naproxen (NAPROSYN) tablet 500 mg  500 mg  Oral  BID PRN  Sanjuana Kava, NP   500 mg at 07/24/11 0645   .  propranolol (INDERAL) tablet 10 mg  10 mg  Oral  QID  Mike Craze, MD   10 mg at 07/25/11 2147    Followed by   .  propranolol (INDERAL) tablet 20 mg  20 mg  Oral  QID  Mike Craze, MD     .  traZODone (DESYREL) tablet 100 mg  100 mg  Oral  QHS  Sanjuana Kava, NP   100 mg at 07/25/11 2147    Lab Results:  Results for orders placed during the hospital encounter of 07/23/11 (from the past 72 hour(s))  TSH     Status: Normal   Collection Time   07/23/11  8:05 PM      Component Value Range Comment   TSH 1.193  0.350 - 4.500 uIU/mL     RISK REDUCTION FACTORS: What pt has  learned from hospital stay is that we are not an [place] and that she needs to recognize who she can ask and then ask for help  Risk of self harm is elevated by her anxiety, but she has discovered that she has herself to live for.  Risk of harm to others is minimal in that she has not been involved in fights or had any legal charges filed on her.  Pt seen in treatment team .tdc  PLAN: Discharge home Continue Medication List  As of 07/26/2011 12:03 AM   ASK your doctor about these medications      Indication    ibuprofen 800 MG tablet   Commonly known as: ADVIL,MOTRIN   Take 800 mg by mouth every 8 (eight) hours as needed. For pain            Follow-up recommendations:  Activities: Resume typical activities Diet: Resume typical diet Other: Follow up with outpatient provider and report any side effects to out patient prescriber.  Plan:  Pt describes anxiety that leaves her numb over her face and legs. This sounds like the peripheral effects of the stress response. She notes not being able to remember things and how to do things then this happens. Will try Inderal for that. She is anxious to leave tomorrow. Will see how this Inderal helps her. She will follow up with the [place].   Krishawna Stiefel 07/26/2011, 12:02 AM

## 2011-07-26 NOTE — Progress Notes (Signed)
        [facility] Group Notes: (Counselor/Nursing/MHT/Case Management/Adjunct) 07/26/2011   @1 :15pm [employer] [facility] in [facility]  Type of Therapy:  Group Therapy  Participation Level:  Good  Participation Quality:  Good  Affect:  Appropriate  Cognitive:  Appropriate  Insight:  Good  Engagement in Group:  Good  Engagement in Therapy:  Good  Modes of Intervention:  Support and Exploration  Summary of Progress/Problems: Kerie participated with speaker from [employer] of  and expressed interest in programs [facility] offers. She shared that she thought [facility] was another agency with therapists and doctors, but was glad to learn about it being a peer community. She lamented that she will not be able to attend classes due to her work schedule, but stated she might go to some of the evening support groups.   [name] 07/26/2011  2:43 PM

## 2011-07-27 NOTE — Progress Notes (Signed)
Patient Discharge Instructions:  After Visit Summary (AVS):   Faxed to:  07/27/2011 Psychiatric Admission Assessment Note:   Faxed to:  07/27/2011 Suicide Risk Assessment - Discharge Assessment:   Faxed to:  07/27/2011 Faxed/Sent to the Next Level Care provider:  07/27/2011  Faxed to [facility] @ [phone]  Heloise Purpura, Eduard Clos, 07/27/2011, 6:38 PM

## 2011-07-27 NOTE — Discharge Summary (Signed)
Physician Discharge Summary Note  Patient:  Tracie Cannon is an 36 y.o., female MRN:  [id number] DOB:  [birth year] Patient phone:  [phone] (home)  Patient address:   [address] [place] [address]   Date of Admission:  07/23/2011 Date of Discharge: 07/26/2011  Discharge Diagnoses: Principal Problem:  *GAD (generalized anxiety disorder)  Axis Diagnosis:  AXIS I: Generalized Anxiety Disorder  AXIS II: Deferred  AXIS III: No past medical history on file.  AXIS IV: other psychosocial or environmental problems, problems related to social environment and problems with primary support group  AXIS V: 51-60 moderate symptoms   Level of Care:  OP  Hospital Course:   This is a voluntary admission for this 36 yr old Tracie Cannon who presented to the ED reporting increasing symptoms of depression with suicidal ideation. She has a plan to cut her wrists or to overdose on pills. She has access to both. She denies any previous attempts.  While a patient in this hospital, Tracie Cannon received medication management for generalized anxiety. They were ordered and received Celexa for depression and anxiety, Inderal for social anxiety, and Trazodone for insomnis. They were also enrolled in group counseling sessions and activities in which they participated actively.   Patient attended treatment team meeting this am and met with treatment team members. Pt symptoms, treatment plan and response to treatment discussed. Tracie Cannon endorsed that their symptoms have improved. Pt also stated that they are stable for discharge.  They reported that from this hospital stay they had learned that medications can help her not feel anxious.  In other to maintain control of their anxiety, mood, and sleep wake cycle, they will continue psychiatric care on outpatient basis. They will follow-up at [facility] on 7/19 at 0745.  In addition they were instructed to take all your medications as prescribed by your mental  healthcare provider, to report any adverse effects and or reactions from your medicines to your outpatient provider promptly, patient is instructed and cautioned to not engage in alcohol and or illegal drug use while on prescription medicines, in the event of worsening symptoms, patient is instructed to call the crisis hotline, 911 and or go to the nearest ED for appropriate evaluation and treatment of symptoms as well as there is a long term form of Inderal that costs more, but is more convenient.  Upon discharge, patient adamantly denies suicidal, homicidal ideations, auditory, visual hallucinations and or delusional thinking. They left [facility] with all personal belongings via personal transportation in no apparent distress.  Consults:  None  Significant Diagnostic Studies:  labs: H/H and indecies low indicating possible iron deficient anemia, UDS positive for THC, Acetominophen, blood alcohol, CMET and TSH non contributory  Discharge Vitals:   Blood pressure 121/83, pulse 73, temperature 96.9 F (36.1 C), temperature source Oral, resp. rate 16, height 5\' 4"  (1.626 m), weight 78.926 kg (174 lb), last menstrual period 07/23/2011..  Mental Status Exam: See Mental Status Examination and Suicide Risk Assessment completed by Attending Physician prior to discharge.  Discharge destination:  Home  Is patient on multiple antipsychotic therapies at discharge:  No  Has Patient had three or more failed trials of antipsychotic monotherapy by history: N/A Recommended Plan for Multiple Antipsychotic Therapies: N/A  Medication List  As of 07/27/2011 12:48 AM   TAKE these medications      Indication    citalopram 20 MG tablet   Commonly known as: CELEXA   Take 1 tablet (20 mg total) by mouth  daily. For depression.       hydrOXYzine 25 MG tablet   Commonly known as: ATARAX/VISTARIL   Take 1 tablet (25 mg total) by mouth 3 (three) times daily as needed for anxiety.       ibuprofen 800 MG tablet   Commonly  known as: ADVIL,MOTRIN   Take 800 mg by mouth every 8 (eight) hours as needed. For pain       propranolol 20 MG tablet   Commonly known as: INDERAL   Take 1 tablet (20 mg total) by mouth 4 (four) times daily. For social anxiety.       traZODone 100 MG tablet   Commonly known as: DESYREL   Take 1 tablet (100 mg total) by mouth at bedtime. For insomnia.            Follow-up Information    Follow up with [facility] on 07/27/2011. (You are scheduled to be seen at [facility] on Friday, July 27, 2011 at 7:45 AM)    Contact information:   [address] [place], [place]  [address]  [phone]        Follow-up recommendations:   Activities: Resume typical activities Diet: Resume typical diet Other: Follow up with outpatient provider and report any side effects to out patient prescriber.  Comments:  Take all your medications as prescribed by your mental healthcare provider. Report any adverse effects and or reactions from your medicines to your outpatient provider promptly. Patient is instructed and cautioned to not engage in alcohol and or illegal drug use while on prescription medicines. In the event of worsening symptoms, patient is instructed to call the crisis hotline, 911 and or go to the nearest ED for appropriate evaluation and treatment of symptoms.  SignedDan Humphreys, Valen Mascaro 07/27/2011 12:48 AM

## 2013-09-03 ENCOUNTER — Other Ambulatory Visit (HOSPITAL_COMMUNITY)
Admission: RE | Admit: 2013-09-03 | Discharge: 2013-09-03 | Disposition: A | Discharge status: Home / Self Care | Payer: Federal, State, Local not specified - PPO | Source: Ambulatory Visit | Attending: Family Medicine | Admitting: Family Medicine

## 2013-09-03 ENCOUNTER — Other Ambulatory Visit: Payer: Self-pay | Admitting: Family Medicine

## 2013-09-03 DIAGNOSIS — Z124 Encounter for screening for malignant neoplasm of cervix: Secondary | ICD-10-CM | POA: Diagnosis present

## 2013-09-03 DIAGNOSIS — Z1151 Encounter for screening for human papillomavirus (HPV): Secondary | ICD-10-CM | POA: Diagnosis present

## 2013-09-07 ENCOUNTER — Other Ambulatory Visit: Payer: Self-pay | Admitting: Family Medicine

## 2013-09-07 DIAGNOSIS — N6012 Diffuse cystic mastopathy of left breast: Secondary | ICD-10-CM

## 2013-09-07 DIAGNOSIS — N6011 Diffuse cystic mastopathy of right breast: Secondary | ICD-10-CM

## 2013-09-08 LAB — CYTOLOGY - PAP

## 2013-09-15 ENCOUNTER — Other Ambulatory Visit: Payer: Self-pay

## 2015-05-07 ENCOUNTER — Encounter (HOSPITAL_BASED_OUTPATIENT_CLINIC_OR_DEPARTMENT_OTHER): Payer: Self-pay | Admitting: Emergency Medicine

## 2015-05-07 ENCOUNTER — Emergency Department (HOSPITAL_BASED_OUTPATIENT_CLINIC_OR_DEPARTMENT_OTHER)
Admission: EM | Admit: 2015-05-07 | Discharge: 2015-05-07 | Disposition: A | Discharge status: Home / Self Care | Payer: BLUE CROSS/BLUE SHIELD | Attending: Emergency Medicine | Admitting: Emergency Medicine

## 2015-05-07 ENCOUNTER — Emergency Department (HOSPITAL_BASED_OUTPATIENT_CLINIC_OR_DEPARTMENT_OTHER): Payer: BLUE CROSS/BLUE SHIELD

## 2015-05-07 DIAGNOSIS — Y939 Activity, unspecified: Secondary | ICD-10-CM | POA: Insufficient documentation

## 2015-05-07 DIAGNOSIS — Y999 Unspecified external cause status: Secondary | ICD-10-CM | POA: Insufficient documentation

## 2015-05-07 DIAGNOSIS — Y9289 Other specified places as the place of occurrence of the external cause: Secondary | ICD-10-CM | POA: Diagnosis not present

## 2015-05-07 DIAGNOSIS — S92352A Displaced fracture of fifth metatarsal bone, left foot, initial encounter for closed fracture: Secondary | ICD-10-CM | POA: Diagnosis not present

## 2015-05-07 DIAGNOSIS — S99922A Unspecified injury of left foot, initial encounter: Secondary | ICD-10-CM | POA: Diagnosis present

## 2015-05-07 DIAGNOSIS — W108XXA Fall (on) (from) other stairs and steps, initial encounter: Secondary | ICD-10-CM | POA: Diagnosis not present

## 2015-05-07 DIAGNOSIS — F17200 Nicotine dependence, unspecified, uncomplicated: Secondary | ICD-10-CM | POA: Diagnosis not present

## 2015-05-07 DIAGNOSIS — S92302A Fracture of unspecified metatarsal bone(s), left foot, initial encounter for closed fracture: Secondary | ICD-10-CM

## 2015-05-07 MED ORDER — OXYCODONE-ACETAMINOPHEN 5-325 MG PO TABS
2.0000 | ORAL_TABLET | Freq: Once | ORAL | Status: AC
  Administered 2015-05-07: 2 via ORAL
  Filled 2015-05-07: qty 2

## 2015-05-07 MED ORDER — HYDROCODONE-ACETAMINOPHEN 5-325 MG PO TABS
2.0000 | ORAL_TABLET | ORAL | Status: AC | PRN
Start: 2015-05-07 — End: ?

## 2015-05-07 MED ORDER — NAPROXEN 500 MG PO TABS: 500.0000 mg | ORAL_TABLET | Freq: Two times a day (BID) | ORAL | Status: AC

## 2015-05-07 NOTE — ED Provider Notes (Signed)
CSN: [id number]     Arrival date & time 05/07/15  2101 History   First MD Initiated Contact with Patient 05/07/15 2151     Chief Complaint  Patient presents with  . Fall  . Foot Pain     (Consider location/radiation/quality/duration/timing/severity/associated sxs/prior Treatment) HPI Navya Sturgess is a 40 y.o. female here for evaluation of left foot pain after a mechanical fall today. Patient reports he slipped going down the steps, landed on her left foot in for sudden onset throbbing pain. Palpation and movement worsens her discomfort. She has not tried anything to improve her symptoms. Denies any redness, numbness or weakness, cool extremities. She does report swelling and pain on the outside of her left foot. No other modifying factors  History reviewed. No pertinent past medical history. History reviewed. No pertinent past surgical history. History reviewed. No pertinent family history. Social History  Substance Use Topics  . Smoking status: Current Every Day Smoker  . Smokeless tobacco: None  . Alcohol Use: Yes     Comment: drinks 2-3 shots daily at nightime to aid sleeping   OB History    No data available     Review of Systems A 10 point review of systems was completed and was negative except for pertinent positives and negatives as mentioned in the history of present illness     Allergies  Review of patient's allergies indicates no known allergies.  Home Medications   Prior to Admission medications   Medication Sig Start Date End Date Taking? Authorizing Provider  citalopram (CELEXA) 20 MG tablet Take 1 tablet (20 mg total) by mouth daily. For depression. 07/26/11 07/25/12  Mike Craze, MD  HYDROcodone-acetaminophen (NORCO/VICODIN) 5-325 MG tablet Take 2 tablets by mouth every 4 (four) hours as needed. 05/07/15   Joycie Peek, PA-C  ibuprofen (ADVIL,MOTRIN) 800 MG tablet Take 800 mg by mouth every 8 (eight) hours as needed. For pain    Historical Provider, MD   naproxen (NAPROSYN) 500 MG tablet Take 1 tablet (500 mg total) by mouth 2 (two) times daily. 05/07/15   Joycie Peek, PA-C  propranolol (INDERAL) 20 MG tablet Take 1 tablet (20 mg total) by mouth 4 (four) times daily. For social anxiety. 07/26/11 07/25/12  Mike Craze, MD  traZODone (DESYREL) 100 MG tablet Take 1 tablet (100 mg total) by mouth at bedtime. For insomnia. 07/26/11 08/25/11  Mike Craze, MD   BP 122/92 mmHg  Pulse 88  Temp(Src) 98.8 F (37.1 C) (Oral)  Resp 18  Ht  (1.651 m)  Wt 74.844 kg  BMI 27.46 kg/m2  SpO2 100%  LMP 04/19/2015 Physical Exam  Constitutional:  Awake, alert, nontoxic appearance.  HENT:  Head: Atraumatic.  Eyes: Right eye exhibits no discharge. Left eye exhibits no discharge.  Neck: Neck supple.  Cardiovascular: Normal rate, regular rhythm and normal heart sounds.   Pulmonary/Chest: Effort normal. She exhibits no tenderness.  Abdominal: Soft. There is no tenderness. There is no rebound.  Musculoskeletal:  Range of motion of fifth digit on left foot decreased secondary to pain only. Sensation is intact to light touch. She has tenderness at the base of the fifth metatarsal on the left foot. Mild diffuse swelling. Distal pulses are intact with brisk cap refill. Full active range of motion of ankle and knee. No other lesions or deformities noted.  Neurological:  Mental status and motor strength appears baseline for patient and situation.  Skin: No rash noted.  Psychiatric: She has a normal mood  and affect.  Nursing note and vitals reviewed.   ED Course  Procedures (including critical care time) Labs Review Labs Reviewed - No data to display  Imaging Review Dg Foot Complete Left  05/07/2015  CLINICAL DATA:  Status post fall walking down brick stairs, with left dorsal foot pain. Initial encounter. EXAM: LEFT FOOT - COMPLETE 3+ VIEW COMPARISON:  None. FINDINGS: There is a mildly comminuted avulsion fracture through the base of the fifth  metatarsal. Mild overlying soft tissue swelling is noted. No additional fractures are seen. An os peroneum is noted. The subtalar joint is grossly unremarkable. Visualized joint spaces are otherwise grossly clear. IMPRESSION: 1. Mildly comminuted avulsion fracture through the base of the fifth metatarsal. 2. Os peroneum noted. Electronically Signed   By: Roanna RaiderJeffery  Chang M.D.   On: 05/07/2015 21:38   I have personally reviewed and evaluated these images and lab results as part of my medical decision-making.   EKG Interpretation None      MDM  Milinda HirschfeldKizzy Showers is a 40 y.o. female here for evaluation of left foot pain after mechanical fall. Found to have mildly comminuted avulsion fracture at base of fifth metatarsal. She remains neurovascularly intact. Given postop shoe, crutches, short course pain medicines. Encouraged NSAID use at home, follow up with PCP next week. She verbalizes understanding and agrees with this plan as well as subsequent discharge. Voices no other questions or concerns at this time. Hemodynamically stable, afebrile and appropriate for discharge. Final diagnoses:  Fracture of fifth metatarsal bone, left, closed, initial encounter        Joycie PeekBenjamin Shlok Raz, PA-C 05/08/15 0000  Tilden FossaElizabeth Rees, MD 05/08/15 1453

## 2015-05-07 NOTE — Discharge Instructions (Signed)
Take your pain medicine as we discussed, do not take this before driving or operating machinery. Follow up with your doctor for reevaluation next week. If symptoms persist he may follow-up with orthopedics. Return to ED for new or worsening symptoms.  Metatarsal Fracture A metatarsal fracture is a break in a metatarsal bone. Metatarsal bones connect your toe bones to your ankle bones. CAUSES This type of fracture may be caused by:  A sudden twisting of your foot.  A fall onto your foot.  Overuse or repetitive exercise. RISK FACTORS This condition is more likely to develop in people who:  Play contact sports.  Have a bone disease.  Have a low calcium level. SYMPTOMS Symptoms of this condition include:  Pain that is worse when walking or standing.  Pain when pressing on the foot or moving the toes.  Swelling.  Bruising on the top or bottom of the foot.  A foot that appears shorter than the other one. DIAGNOSIS This condition is diagnosed with a physical exam. You may also have imaging tests, such as:  X-rays.  A CT scan.  MRI. TREATMENT Treatment for this condition depends on its severity and whether a bone has moved out of place. Treatment may involve:  Rest.  Wearing foot support such as a cast, splint, or boot for several weeks.  Using crutches.  Surgery to move bones back into the right position. Surgery is usually needed if there are many pieces of broken bone or bones that are very out of place (displaced fracture).  Physical therapy. This may be needed to help you regain full movement and strength in your foot. You will need to return to your health care provider to have X-rays taken until your bones heal. Your health care provider will look at the X-rays to make sure that your foot is healing well. HOME CARE INSTRUCTIONS  If You Have a Cast:  Do not stick anything inside the cast to scratch your skin. Doing that increases your risk of infection.  Check  the skin around the cast every day. Report any concerns to your health care provider. You may put lotion on dry skin around the edges of the cast. Do not apply lotion to the skin underneath the cast.  Keep the cast clean and dry. If You Have a Splint or a Supportive Boot:  Wear it as directed by your health care provider. Remove it only as directed by your health care provider.  Loosen it if your toes become numb and tingle, or if they turn cold and blue.  Keep it clean and dry. Bathing  Do not take baths, swim, or use a hot tub until your health care provider approves. Ask your health care provider if you can take showers. You may only be allowed to take sponge baths for bathing.  If your health care provider approves bathing and showering, cover the cast or splint with a watertight plastic bag to protect it from water. Do not let the cast or splint get wet. Managing Pain, Stiffness, and Swelling  If directed, apply ice to the injured area (if you have a splint, not a cast).  Put ice in a plastic bag.  Place a towel between your skin and the bag.  Leave the ice on for 20 minutes, 2-3 times per day.  Move your toes often to avoid stiffness and to lessen swelling.  Raise (elevate) the injured area above the level of your heart while you are sitting or lying down.  Driving  Do not drive or operate heavy machinery while taking pain medicine.  Do not drive while wearing foot support on a foot that you use for driving. Activity  Return to your normal activities as directed by your health care provider. Ask your health care provider what activities are safe for you.  Perform exercises as directed by your health care provider or physical therapist. Safety  Do not use the injured foot to support your body weight until your health care provider says that you can. Use crutches as directed by your health care provider. General Instructions  Do not put pressure on any part of the cast  or splint until it is fully hardened. This may take several hours.  Do not use any tobacco products, including cigarettes, chewing tobacco, or e-cigarettes. Tobacco can delay bone healing. If you need help quitting, ask your health care provider.  Take medicines only as directed by your health care provider.  Keep all follow-up visits as directed by your health care provider. This is important. SEEK MEDICAL CARE IF:  You have a fever.  Your cast, splint, or boot is too loose or too tight.  Your cast, splint, or boot is damaged.  Your pain medicine is not helping.  You have pain, tingling, or numbness in your foot that is not going away. SEEK IMMEDIATE MEDICAL CARE IF:  You have severe pain.  You have tingling or numbness in your foot that is getting worse.  Your foot feels cold or becomes numb.  Your foot changes color.   This information is not intended to replace advice given to you by your health care provider. Make sure you discuss any questions you have with your health care provider.   Document Released: 09/16/2001 Document Revised: 05/11/2014 Document Reviewed: 10/21/2013 [employer] Interactive Patient Education [employer]

## 2015-05-07 NOTE — ED Notes (Signed)
Pt in c/o fall today while walking. Abrasion to R lower leg, pain to L foot. Swelling noted.

## 2017-08-28 IMAGING — DX DG FOOT COMPLETE 3+V*L*
3 series · 3 of 3 positions shown · non-contrast
Comparison: None.

CLINICAL DATA: Status post fall walking down brick stairs, with
left dorsal foot pain. Initial encounter.

EXAM:
LEFT FOOT - COMPLETE 3+ VIEW

[foot ap]
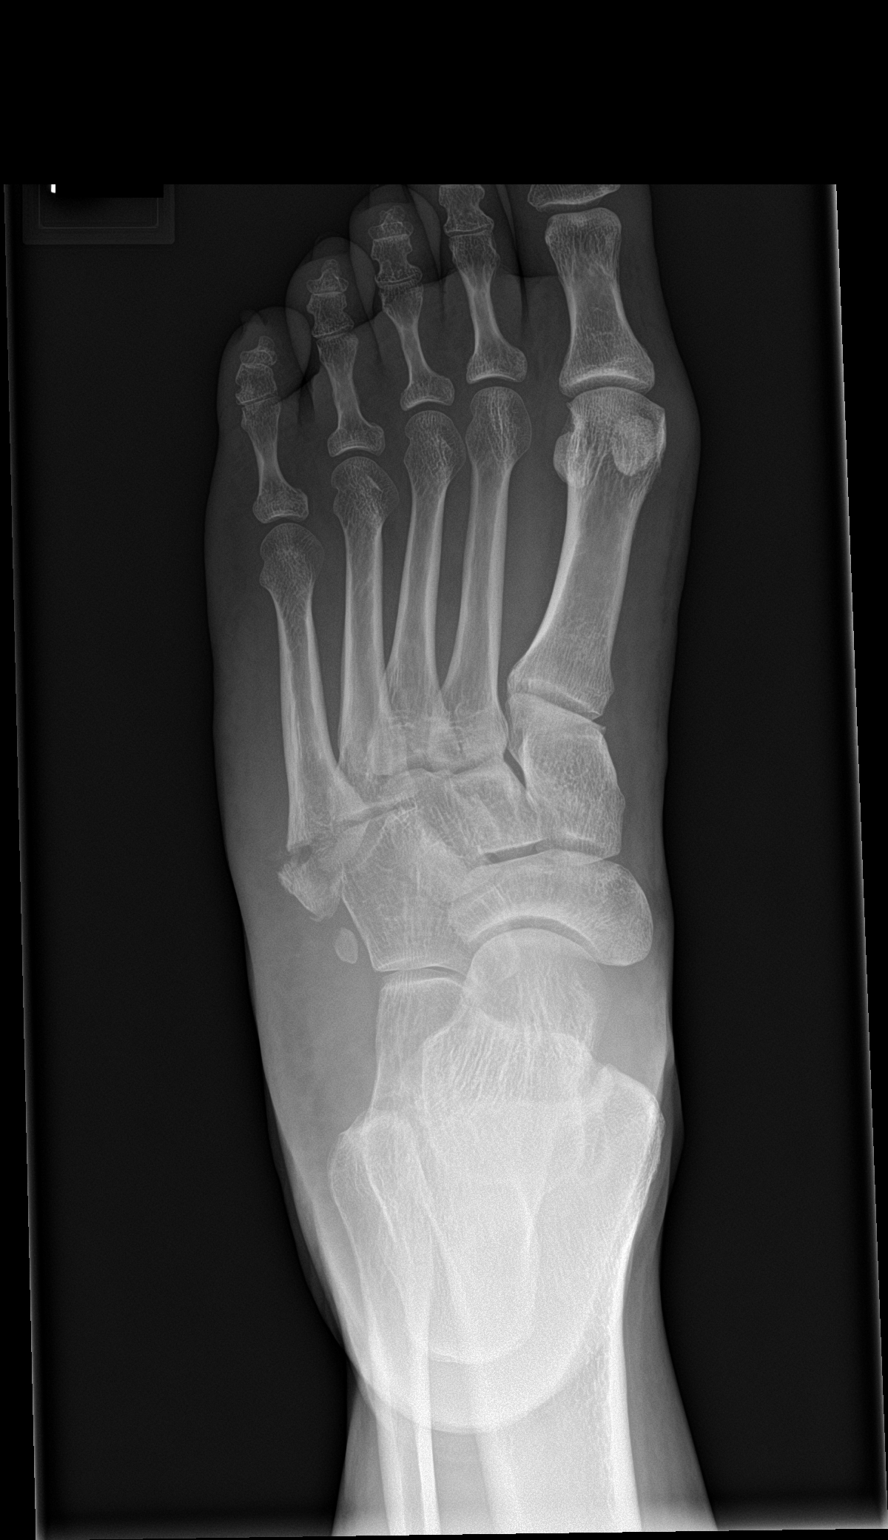

[foot obl]
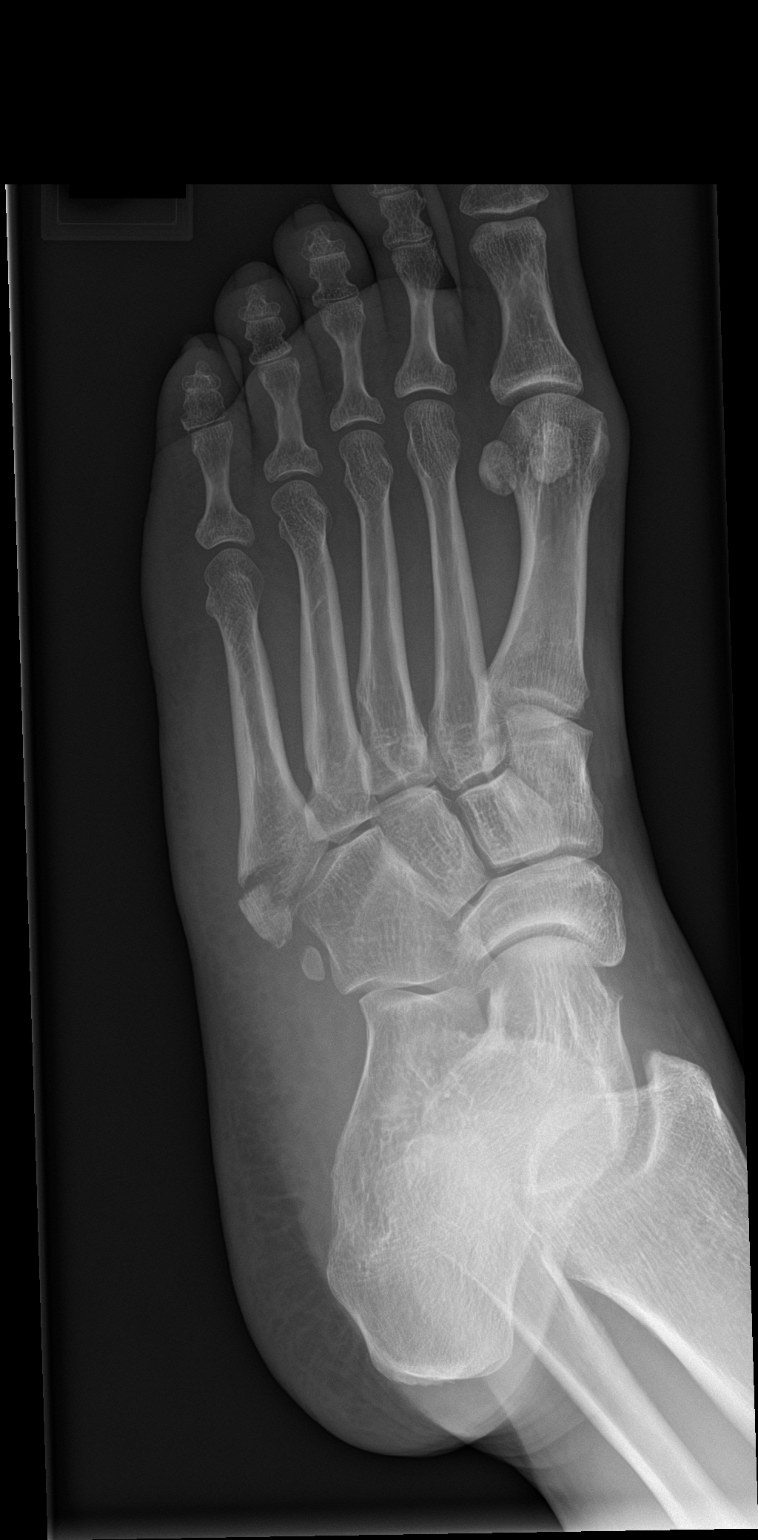

[foot lat]
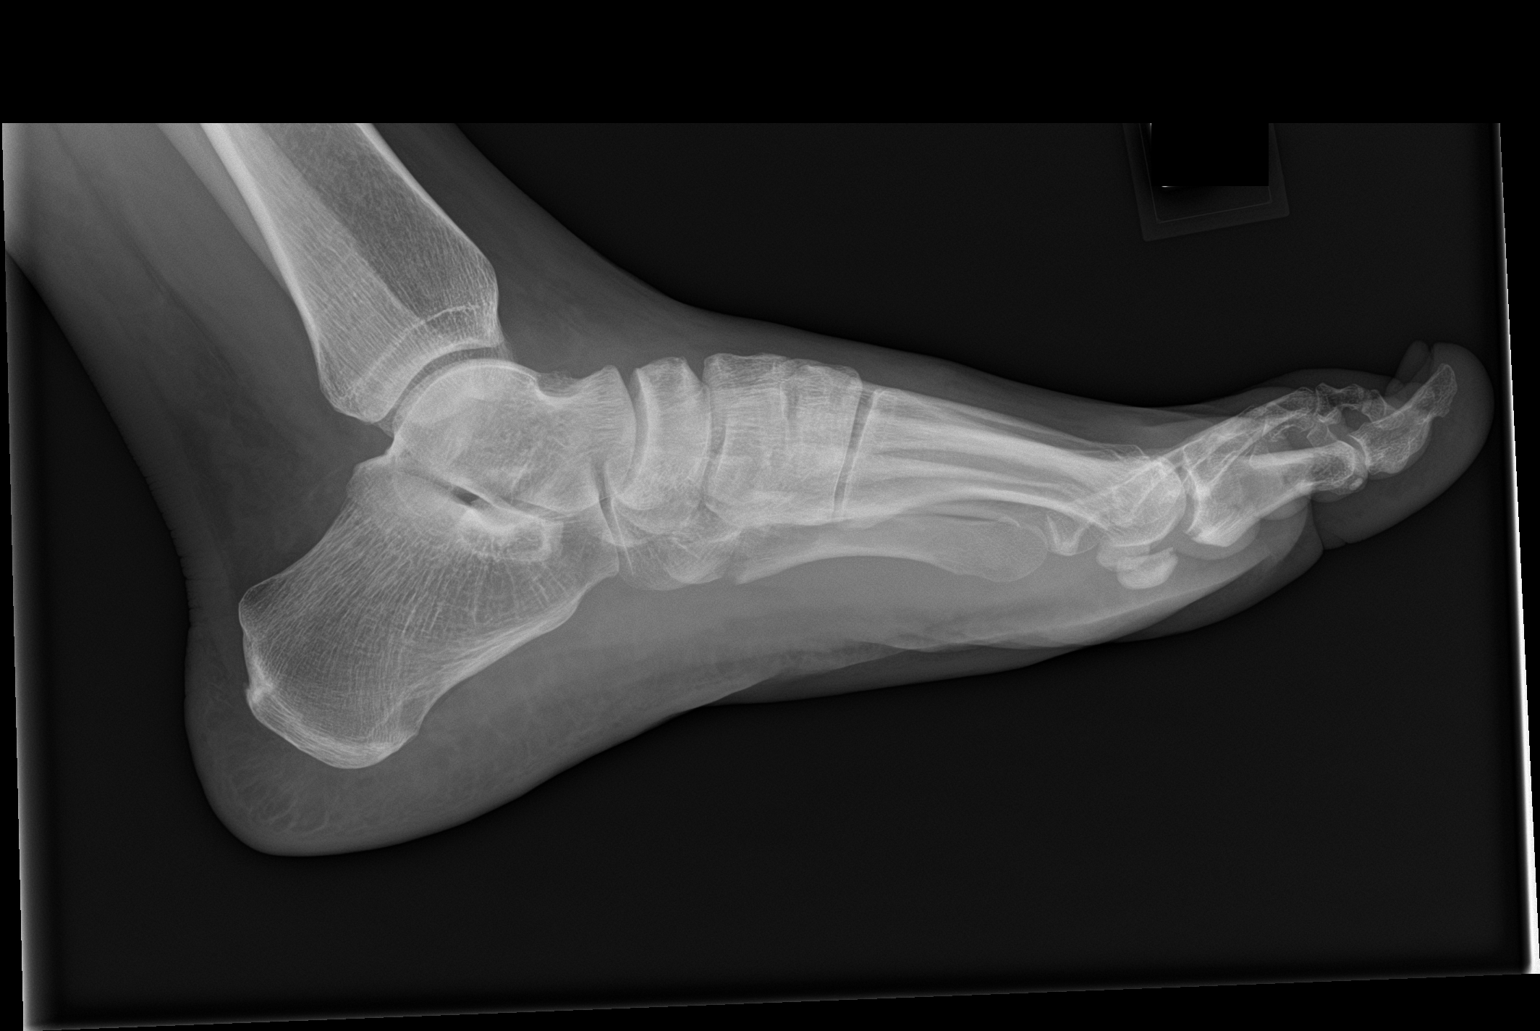

[3 of 3 positions shown; findings below may reference images not displayed]

FINDINGS: There is a mildly comminuted avulsion fracture through the base of
the fifth metatarsal. Mild overlying soft tissue swelling is noted.

No additional fractures are seen. An os peroneum is noted. The
subtalar joint is grossly unremarkable. Visualized joint spaces are
otherwise grossly clear.
IMPRESSION: 1. Mildly comminuted avulsion fracture through the base of the fifth
metatarsal.
2. Os peroneum noted.

## 2018-10-31 ENCOUNTER — Other Ambulatory Visit (INDEPENDENT_AMBULATORY_CARE_PROVIDER_SITE_OTHER): Payer: Self-pay | Admitting: Primary Care

## 2018-10-31 MED ORDER — BLOOD PRESSURE MONITOR KIT: 1.0000 | PACK | Freq: Three times a day (TID) | 0 refills | Status: AC
